# Patient Record
Sex: Male | Born: 1995 | Race: Black or African American | Hispanic: No | Marital: Single | Smoking: Current every day smoker
Health system: Southern US, Community
[De-identification: ages and names within clinical notes are randomized; demographics above are authoritative.]

## PROBLEM LIST (undated history)

## (undated) HISTORY — PX: ADENOIDECTOMY: SUR15

---
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
## 2013-01-25 ENCOUNTER — Telehealth (HOSPITAL_COMMUNITY): Payer: Self-pay | Admitting: *Deleted

## 2013-01-25 NOTE — ED Notes (Signed)
Pt. Called for his lab results. Pt. verified x 2 and given neg. results.  David Serrano 01/25/2013

## 2013-09-27 ENCOUNTER — Encounter (HOSPITAL_COMMUNITY): Payer: Self-pay | Admitting: Emergency Medicine

## 2013-09-27 ENCOUNTER — Other Ambulatory Visit (HOSPITAL_COMMUNITY)
Admission: RE | Admit: 2013-09-27 | Discharge: 2013-09-27 | Disposition: A | Discharge status: Home / Self Care | Payer: BC Managed Care – PPO | Source: Ambulatory Visit | Attending: Family Medicine | Admitting: Family Medicine

## 2013-09-27 ENCOUNTER — Emergency Department (INDEPENDENT_AMBULATORY_CARE_PROVIDER_SITE_OTHER)
Admission: EM | Admit: 2013-09-27 | Discharge: 2013-09-27 | Disposition: A | Discharge status: Home / Self Care | Payer: BC Managed Care – PPO | Source: Home / Self Care | Attending: Family Medicine | Admitting: Family Medicine

## 2013-09-27 DIAGNOSIS — Z113 Encounter for screening for infections with a predominantly sexual mode of transmission: Secondary | ICD-10-CM | POA: Insufficient documentation

## 2013-09-27 DIAGNOSIS — N342 Other urethritis: Secondary | ICD-10-CM

## 2013-09-27 NOTE — Discharge Instructions (Signed)
Thank you for coming in today.  We will call you if anything comes back positive.    Sexually Transmitted Disease A sexually transmitted disease (STD) is a disease or infection that may be passed (transmitted) from person to person, usually during sexual activity. This may happen by way of saliva, semen, blood, vaginal mucus, or urine. Common STDs include:   Gonorrhea.   Chlamydia.   Syphilis.   HIV and AIDS.   Genital herpes.   Hepatitis B and C.   Trichomonas.   Human papillomavirus (HPV).   Pubic lice.   Scabies.  Mites.  Bacterial vaginosis. WHAT ARE CAUSES OF STDs? An STD may be caused by bacteria, a virus, or parasites. STDs are often transmitted during sexual activity if one person is infected. However, they may also be transmitted through nonsexual means. STDs may be transmitted after:   Sexual intercourse with an infected person.   Sharing sex toys with an infected person.   Sharing needles with an infected person or using unclean piercing or tattoo needles.  Having intimate contact with the genitals, mouth, or rectal areas of an infected person.   Exposure to infected fluids during birth. WHAT ARE THE SIGNS AND SYMPTOMS OF STDs? Different STDs have different symptoms. Some people may not have any symptoms. If symptoms are present, they may include:   Painful or bloody urination.   Pain in the pelvis, abdomen, vagina, anus, throat, or eyes.   A skin rash, itching, or irritation.  Growths, ulcerations, blisters, or sores in the genital and anal areas.  Abnormal vaginal discharge with or without bad odor.   Penile discharge in men.   Fever.   Pain or bleeding during sexual intercourse.   Swollen glands in the groin area.   Yellow skin and eyes (jaundice). This is seen with hepatitis.   Swollen testicles.  Infertility.  Sores and blisters in the mouth. HOW ARE STDs DIAGNOSED? To make a diagnosis, your health care  provider may:   Take a medical history.   Perform a physical exam.   Take a sample of any discharge to examine.  Swab the throat, cervix, opening to the penis, rectum, or vagina for testing.  Test a sample of your first morning urine.   Perform blood tests.   Perform a Pap test, if this applies.   Perform a colposcopy.   Perform a laparoscopy.  HOW ARE STDs TREATED? Treatment depends on the STD. Some STDs may be treated but not cured.   Chlamydia, gonorrhea, trichomonas, and syphilis can be cured with antibiotic medicine.   Genital herpes, hepatitis, and HIV can be treated, but not cured, with prescribed medicines. The medicines lessen symptoms.   Genital warts from HPV can be treated with medicine or by freezing, burning (electrocautery), or surgery. Warts may come back.   HPV cannot be cured with medicine or surgery. However, abnormal areas may be removed from the cervix, vagina, or vulva.   If your diagnosis is confirmed, your recent sexual partners need treatment. This is true even if they are symptom-free or have a negative culture or evaluation. They should not have sex until their health care providers say it is okay. HOW CAN I REDUCE MY RISK OF GETTING AN STD? Take these steps to reduce your risk of getting an STD:  Use latex condoms, dental dams, and water-soluble lubricants during sexual activity. Do not use petroleum jelly or oils.  Avoid having multiple sex partners.  Do not have sex with someone who  has other sex partners.  Do not have sex with anyone you do not know or who is at high risk for an STD.  Avoid risky sex practices that can break your skin.  Do not have sex if you have open sores on your mouth or skin.  Avoid drinking too much alcohol or taking illegal drugs. Alcohol and drugs can affect your judgment and put you in a vulnerable position.  Avoid engaging in oral and anal sex acts.  Get vaccinated for HPV and hepatitis. If you  have not received these vaccines in the past, talk to your health care provider about whether one or both might be right for you.   If you are at risk of being infected with HIV, it is recommended that you take a prescription medicine daily to prevent HIV infection. This is called pre-exposure prophylaxis (PrEP). You are considered at risk if:  You are a man who has sex with other men (MSM).  You are a heterosexual man or woman and are sexually active with more than one partner.  You take drugs by injection.  You are sexually active with a partner who has HIV.  Talk with your health care provider about whether you are at high risk of being infected with HIV. If you choose to begin PrEP, you should first be tested for HIV. You should then be tested every 3 months for as long as you are taking PrEP.  WHAT SHOULD I DO IF I THINK I HAVE AN STD?  See your health care provider.   Tell your sexual partner(s). They should be tested and treated for any STDs.  Do not have sex until your health care provider says it is okay. WHEN SHOULD I GET IMMEDIATE MEDICAL CARE? Contact your health care provider right away if:   You have severe abdominal pain.  You are a man and notice swelling or pain in your testicles.  You are a woman and notice swelling or pain in your vagina. Document Released: 05/21/2002 Document Revised: 03/05/2013 Document Reviewed: 09/18/2012 [facility] Patient Information 2015 [place], [place]. This information is not intended to replace advice given to you by your health care provider. Make sure you discuss any questions you have with your health care provider.

## 2013-09-27 NOTE — ED Notes (Signed)
Pt reports having oral sex and states that girlfriend now has sore on mouth.  Pt and partner are here to tested for stds.

## 2013-09-27 NOTE — ED Notes (Signed)
Pt denies penile discharge and any other symptoms.  Mw,cma

## 2013-09-27 NOTE — ED Provider Notes (Signed)
David Serrano is a 18 y.o. male who presents to Urgent Care today for STD tests. Patient notes that his girlfriend developed a sore in her mouth and he is concerned that he or she may have contracted a sexually transmitted infection. He notes no significant symptoms and feels well otherwise.   History reviewed. No pertinent past medical history. History  Substance Use Topics  . Smoking status: Never Smoker   . Smokeless tobacco: Not on file  . Alcohol Use: Yes   ROS as above Medications: No current facility-administered medications for this encounter.   No current outpatient prescriptions on file.    Exam:  BP 139/70  Pulse 64  Temp(Src) 98.7 F (37.1 C) (Oral)  Resp 14  SpO2 100% Gen: Well NAD Lungs: Normal work of breathing. CTABL Heart: RRR no MRG Abd: NABS, Soft. Nontender, Nondistended Exts: Brisk capillary refill, warm and well perfused.  Genital: No inguinal lymphadenopathy Testicles are descended bilaterally and nontender without masses Penis is circumcised with no lesions or discharge  No results found for this or any previous visit (from the past 24 hour(s)). No results found.  Assessment and Plan: 18 y.o. male with possible urethritis. Urine cytology pending. Call patient with positive results.  Discussed warning signs or symptoms. Please see discharge instructions. Patient expresses understanding.   This note was created using Conservation officer, historic buildingsDragon voice recognition software. Any transcription errors are unintended.    Rodolph BongEvan S Corey, MD 09/27/13 [phone]

## 2013-10-02 NOTE — ED Notes (Signed)
patient called to inquire about his test results. After verifying ID, discussed negative reports

## 2014-05-08 ENCOUNTER — Emergency Department (HOSPITAL_COMMUNITY)
Admission: EM | Admit: 2014-05-08 | Discharge: 2014-05-08 | Disposition: A | Discharge status: Home / Self Care | Payer: BLUE CROSS/BLUE SHIELD | Attending: Emergency Medicine | Admitting: Emergency Medicine

## 2014-05-08 ENCOUNTER — Encounter (HOSPITAL_COMMUNITY): Payer: Self-pay | Admitting: Family Medicine

## 2014-05-08 ENCOUNTER — Emergency Department (HOSPITAL_COMMUNITY): Payer: BLUE CROSS/BLUE SHIELD

## 2014-05-08 DIAGNOSIS — Z79899 Other long term (current) drug therapy: Secondary | ICD-10-CM | POA: Diagnosis not present

## 2014-05-08 DIAGNOSIS — R05 Cough: Secondary | ICD-10-CM

## 2014-05-08 DIAGNOSIS — J069 Acute upper respiratory infection, unspecified: Secondary | ICD-10-CM | POA: Diagnosis not present

## 2014-05-08 DIAGNOSIS — R059 Cough, unspecified: Secondary | ICD-10-CM

## 2014-05-08 MED ORDER — HYDROCOD POLST-CHLORPHEN POLST 10-8 MG/5ML PO LQCR: 5.0000 mL | Freq: Two times a day (BID) | ORAL | Status: AC | PRN

## 2014-05-08 MED ORDER — ALBUTEROL 90 MCG/ACT IN AERS: 2.0000 | INHALATION_SPRAY | RESPIRATORY_TRACT | Status: AC | PRN

## 2014-05-08 NOTE — Discharge Instructions (Signed)
Cough, Adult  A cough is a reflex that helps clear your throat and airways. It can help heal the body or may be a reaction to an irritated airway. A cough may only last 2 or 3 weeks (acute) or may last more than 8 weeks (chronic).  CAUSES Acute cough:  Viral or bacterial infections. Chronic cough:  Infections.  Allergies.  Asthma.  Post-nasal drip.  Smoking.  Heartburn or acid reflux.  Some medicines.  Chronic lung problems (COPD).  Cancer. SYMPTOMS   Cough.  Fever.  Chest pain.  Increased breathing rate.  High-pitched whistling sound when breathing (wheezing).  Colored mucus that you cough up (sputum). TREATMENT   A bacterial cough may be treated with antibiotic medicine.  A viral cough must run its course and will not respond to antibiotics.  Your caregiver may recommend other treatments if you have a chronic cough. HOME CARE INSTRUCTIONS   Only take over-the-counter or prescription medicines for pain, discomfort, or fever as directed by your caregiver. Use cough suppressants only as directed by your caregiver.  Use a cold steam vaporizer or humidifier in your bedroom or home to help loosen secretions.  Sleep in a semi-upright position if your cough is worse at night.  Rest as needed.  Stop smoking if you smoke. SEEK IMMEDIATE MEDICAL CARE IF:   You have pus in your sputum.  Your cough starts to worsen.  You cannot control your cough with suppressants and are losing sleep.  You begin coughing up blood.  You have difficulty breathing.  You develop pain which is getting worse or is uncontrolled with medicine.  You have a fever. MAKE SURE YOU:   Understand these instructions.  Will watch your condition.  Will get help right away if you are not doing well or get worse. Document Released: 08/27/2010 Document Revised: 05/23/2011 Document Reviewed: 08/27/2010 [employer] Patient Information 2015 [employer]. This information is not intended  to replace advice given to you by your health care provider. Make sure you discuss any questions you have with your health care provider.  

## 2014-05-08 NOTE — ED Notes (Signed)
p here for productive cough x 1 week.

## 2014-05-08 NOTE — ED Notes (Signed)
MD at bedside. 

## 2014-05-08 NOTE — ED Provider Notes (Signed)
CSN: [id number]     Arrival date & time 05/08/14  2028 History   First MD Initiated Contact with Patient 05/08/14 2226     Chief Complaint  Patient presents with  . Cough     (Consider location/radiation/quality/duration/timing/severity/associated sxs/prior Treatment) HPI David Serrano is a 19 y.o. male who comes in for evaluation of cough. Patient states for the past week he has had an intermittent dry cough that causes some chest discomfort. No chest discomfort at rest only with cough. No radiation. He has not tried anything for his symptoms. He denies any fevers, shortness of breath, facial pain, rhinorrhea, nausea or vomiting, abdominal pain, myalgias, diaphoresis, headache.  History reviewed. No pertinent past medical history. Past Surgical History  Procedure Laterality Date  . Adenoidectomy     History reviewed. No pertinent family history. History  Substance Use Topics  . Smoking status: Never Smoker   . Smokeless tobacco: Not on file  . Alcohol Use: Yes    Review of Systems  Constitutional: Negative for fever.  Respiratory: Positive for cough. Negative for shortness of breath.   Cardiovascular: Negative for chest pain.  Skin: Negative for rash.  All other systems reviewed and are negative.     Allergies  Review of patient's allergies indicates no known allergies.  Home Medications   Prior to Admission medications   Medication Sig Start Date End Date Taking? Authorizing Provider  albuterol (PROVENTIL,VENTOLIN) 90 MCG/ACT inhaler Inhale 2 puffs into the lungs every 4 (four) hours as needed. 05/08/14 05/03/15  Sharlene Motts, PA-C  chlorpheniramine-HYDROcodone (TUSSIONEX PENNKINETIC ER) 10-8 MG/5ML LQCR Take 5 mLs by mouth every 12 (twelve) hours as needed for cough (cough). 05/08/14   Earle Gell Carlesha Seiple, PA-C   BP 135/69 mmHg  Pulse 80  Temp(Src) 98.8 F (37.1 C)  Resp 18  SpO2 98% Physical Exam  Constitutional: He appears well-developed and well-nourished.  No distress.  Awake, alert, nontoxic appearance.  HENT:  Head: Atraumatic.  Eyes: Right eye exhibits no discharge. Left eye exhibits no discharge.  Neck: Normal range of motion. Neck supple.  Cardiovascular: Normal rate, regular rhythm and normal heart sounds.   Pulmonary/Chest: Effort normal and breath sounds normal. No respiratory distress. He has no wheezes. He has no rales. He exhibits no tenderness.  Musculoskeletal: Normal range of motion. He exhibits no tenderness.  Baseline ROM, no obvious new focal weakness.  Neurological:  Mental status and motor strength appears baseline for patient and situation.  Skin: No rash noted. He is not diaphoretic.  Psychiatric: He has a normal mood and affect.  Nursing note and vitals reviewed.   ED Course  Procedures (including critical care time) Labs Review Labs Reviewed - No data to display  Imaging Review Dg Chest 2 View  05/08/2014   CLINICAL DATA:  Cough and congestion for 1 week  EXAM: CHEST  2 VIEW  COMPARISON:  None.  FINDINGS: Cardiac shadow is within normal limits. The lungs are well aerated bilaterally. Minimal atelectatic changes are noted in the right middle lobe. No sizable effusion is seen. No bony abnormality is noted.  IMPRESSION: Mild atelectasis in the right middle lobe.   Electronically Signed   By: Alcide Clever M.D.   On: 05/08/2014 21:15     EKG Interpretation None     Meds given in ED:  Medications - No data to display  New Prescriptions   ALBUTEROL (PROVENTIL,VENTOLIN) 90 MCG/ACT INHALER    Inhale 2 puffs into the lungs every 4 (four) hours  as needed.   CHLORPHENIRAMINE-HYDROCODONE (TUSSIONEX PENNKINETIC ER) 10-8 MG/5ML LQCR    Take 5 mLs by mouth every 12 (twelve) hours as needed for cough (cough).   Filed Vitals:   05/08/14 2049 05/08/14 2235 05/08/14 2240  BP: 135/69 159/96   Pulse: 80  68  Temp: 98.8 F (37.1 C)    Resp: 18    SpO2: 98%  99%    MDM  Vitals stable - WNL -afebrile, no hypoxia Pt  resting comfortably in ED. PE--benign lung exam. Imaging--chest x-ray shows mild atelectasis in right middle lobe.  DDX--Will treat empirically for viral URI with antitussives, bronchodilator. Low concern for other acute or emergent pathology at this time.   I discussed all relevant lab findings and imaging results with pt and they verbalized understanding. Discussed f/u with PCP within 48 hrs and return precautions, pt very amenable to plan.  Final diagnoses:  URI (upper respiratory infection)       Sharlene MottsBenjamin W Leonard Feigel, PA-C 05/08/14 2252  Vida RollerBrian D Miller, MD 05/09/14 [id number]

## 2016-03-14 IMAGING — CR DG CHEST 2V
2 series · 2 of 2 positions shown · non-contrast
Comparison: None.

CLINICAL DATA: Cough and congestion for 1 week

EXAM:
CHEST  2 VIEW

[chest pa]
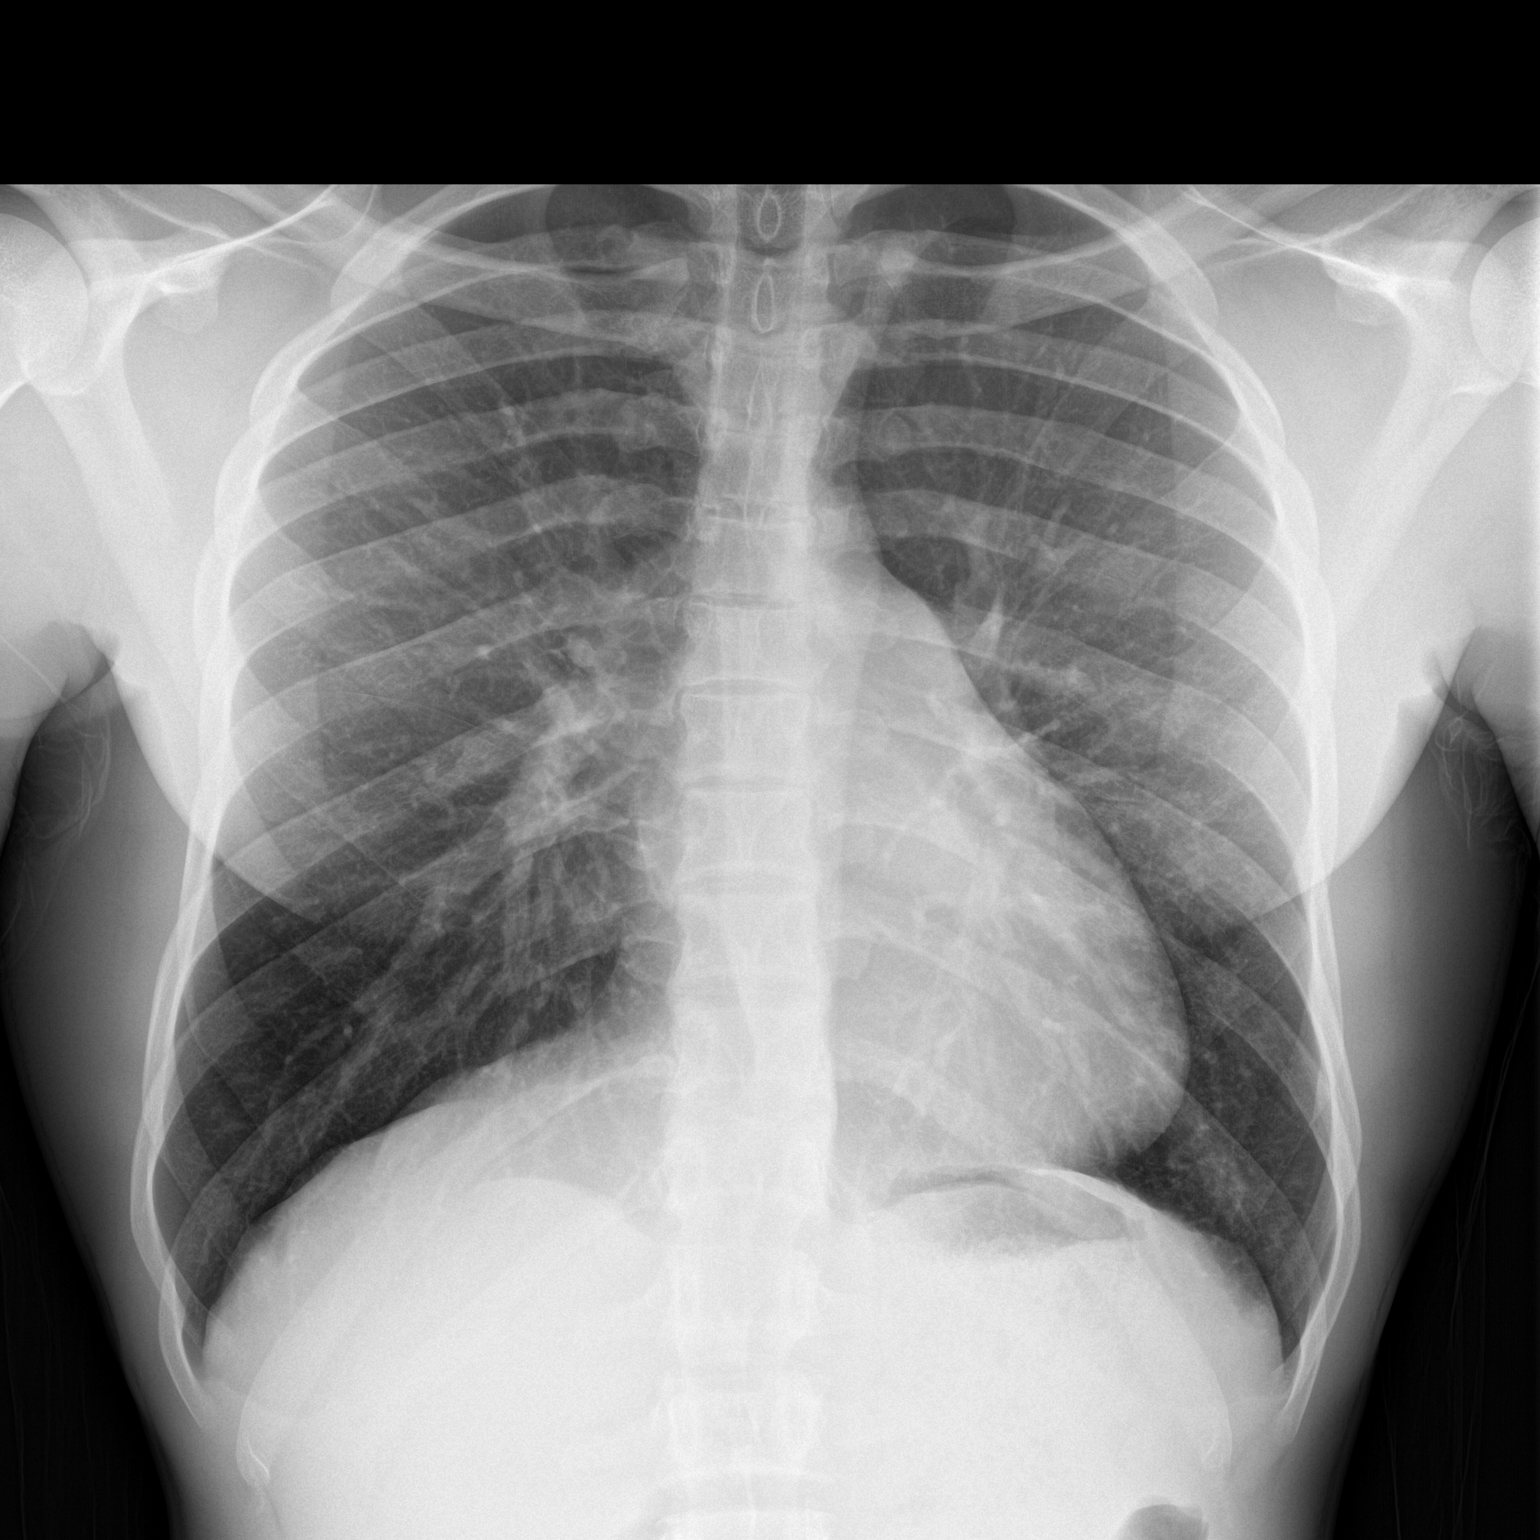

[chest lat]
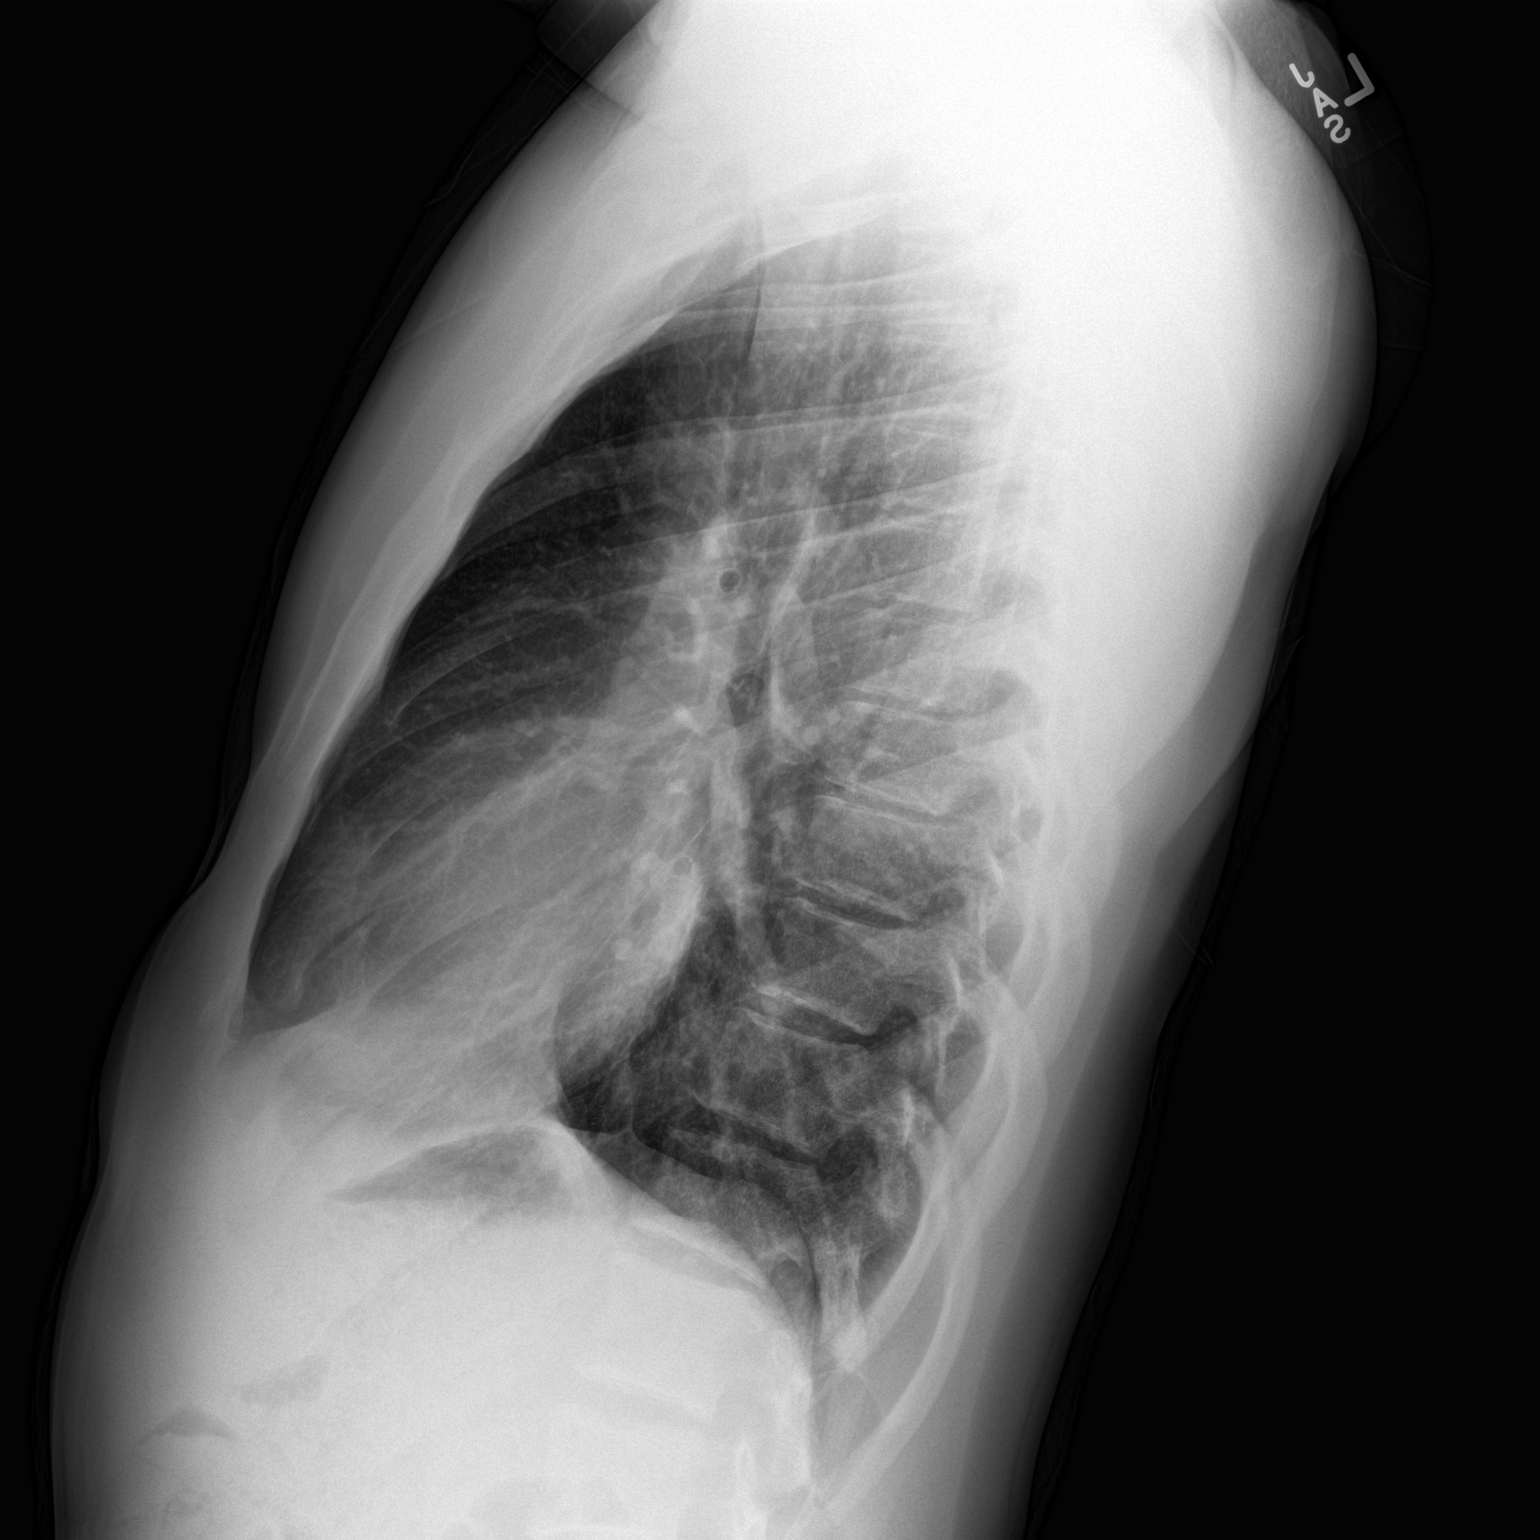

[2 of 2 positions shown; findings below may reference images not displayed]

FINDINGS: Cardiac shadow is within normal limits. The lungs are well aerated
bilaterally. Minimal atelectatic changes are noted in the right
middle lobe. No sizable effusion is seen. No bony abnormality is
noted.
IMPRESSION: Mild atelectasis in the right middle lobe.

## 2019-04-13 ENCOUNTER — Other Ambulatory Visit: Payer: Self-pay

## 2019-04-13 ENCOUNTER — Ambulatory Visit (HOSPITAL_COMMUNITY)
Admission: EM | Admit: 2019-04-13 | Discharge: 2019-04-13 | Disposition: A | Discharge status: Home / Self Care | Payer: BLUE CROSS/BLUE SHIELD | Attending: Family Medicine | Admitting: Family Medicine

## 2019-04-13 ENCOUNTER — Encounter (HOSPITAL_COMMUNITY): Payer: Self-pay

## 2019-04-13 DIAGNOSIS — R369 Urethral discharge, unspecified: Secondary | ICD-10-CM | POA: Insufficient documentation

## 2019-04-13 LAB — HIV ANTIBODY (ROUTINE TESTING W REFLEX): HIV Screen 4th Generation wRfx: NONREACTIVE

## 2019-04-13 MED ORDER — CEFTRIAXONE SODIUM 500 MG IJ SOLR
500.0000 mg | Freq: Once | INTRAMUSCULAR | Status: AC
  Administered 2019-04-13: 12:00:00 500 mg via INTRAMUSCULAR

## 2019-04-13 MED ORDER — CEFTRIAXONE SODIUM 500 MG IJ SOLR
INTRAMUSCULAR | Status: AC
  Filled 2019-04-13: qty 500

## 2019-04-13 MED ORDER — DOXYCYCLINE HYCLATE 100 MG PO CAPS: 100.0000 mg | ORAL_CAPSULE | Freq: Two times a day (BID) | ORAL | 0 refills | Status: AC

## 2019-04-13 NOTE — ED Provider Notes (Signed)
[place]   [id number] 04/13/19 Arrival Time: 1324  ASSESSMENT & PLAN:  1. Penile discharge       Discharge Instructions     You have been given the following today for treatment of suspected gonorrhea and/or chlamydia:  cefTRIAXone (ROCEPHIN) injection 500 mg  Please pick up your prescription for doxycycline 100 mg and begin taking twice daily for the next seven (7) days.  Even though we have treated you today, we have sent testing for sexually transmitted infections. We will notify you of any positive results once they are received. If required, we will prescribe any medications you might need.  Please refrain from all sexual activity for at least the next seven days.     Pending: Labs Reviewed  HIV ANTIBODY (ROUTINE TESTING W REFLEX)  RPR  CYTOLOGY, (ORAL, ANAL, URETHRAL) ANCILLARY ONLY    Will notify of any positive results. Instructed to refrain from sexual activity for at least seven days.  Reviewed expectations re: course of current medical issues. Questions answered. Outlined signs and symptoms indicating need for more acute intervention. Patient verbalized understanding. After Visit Summary given.   SUBJECTIVE:  David Serrano is a 24 y.o. male who presents with complaint of penile discharge. Onset abrupt. First noticed 3 d ago. Describes discharge as thick and opaque. No specific aggravating or alleviating factors reported. Denies: urinary frequency, dysuria and gross hematuria. Afebrile. No abdominal or pelvic pain. No n/v. No rashes or lesions. Reports that he is sexually active with single male partner; without condom use. OTC treatment: none. History of STI: none reported.   OBJECTIVE:  Vitals:   04/13/19 1118  BP: 132/74  Pulse: 67  Resp: 16  Temp: 98.5 F (36.9 C)  TempSrc: Oral  SpO2: 99%     General appearance: alert, cooperative, appears stated age and no distress Throat: lips, mucosa, and tongue normal; teeth and gums  normal CV: RRR Lungs: CTAB Back: no CVA tenderness; FROM at waist Abdomen: soft, non-tender GU: normal appearing genitalia Skin: warm and dry Psychological: alert and cooperative; normal mood and affect.    Labs Reviewed  HIV ANTIBODY (ROUTINE TESTING W REFLEX)  RPR  CYTOLOGY, (ORAL, ANAL, URETHRAL) ANCILLARY ONLY    No Known Allergies   Family History  Problem Relation Age of Onset  . Healthy Mother    Social History   Socioeconomic History  . Marital status: Single    Spouse name: Not on file  . Number of children: Not on file  . Years of education: Not on file  . Highest education level: Not on file  Occupational History  . Not on file  Tobacco Use  . Smoking status: Current Every Day Smoker    Types: Cigars  . Smokeless tobacco: Never Used  Substance and Sexual Activity  . Alcohol use: Yes  . Drug use: Yes    Types: Marijuana  . Sexual activity: Yes    Birth control/protection: None  Other Topics Concern  . Not on file  Social History Narrative  . Not on file   Social Determinants of Health   Financial Resource Strain:   . Difficulty of Paying Living Expenses: Not on file  Food Insecurity:   . Worried About Charity fundraiser in the Last Year: Not on file  . Ran Out of Food in the Last Year: Not on file  Transportation Needs:   . Lack of Transportation (Medical): Not on file  . Lack of Transportation (Non-Medical): Not on file  Physical Activity:   . Days of Exercise per Week: Not on file  . Minutes of Exercise per Session: Not on file  Stress:   . Feeling of Stress : Not on file  Social Connections:   . Frequency of Communication with Friends and Family: Not on file  . Frequency of Social Gatherings with Friends and Family: Not on file  . Attends Religious Services: Not on file  . Active Member of Clubs or Organizations: Not on file  . Attends Banker Meetings: Not on file  . Marital Status: Not on file  Intimate Partner  Violence:   . Fear of Current or Ex-Partner: Not on file  . Emotionally Abused: Not on file  . Physically Abused: Not on file  . Sexually Abused: Not on file          Mardella Layman, MD 04/13/19 1212

## 2019-04-13 NOTE — ED Triage Notes (Signed)
Patient presents to Urgent Care with complaints of penile discharge and pain with urination since three days ago. Patient reports he had unprotected sex recently and has been feeling badly ever since.

## 2019-04-13 NOTE — Discharge Instructions (Addendum)

## 2019-04-14 LAB — RPR: RPR Ser Ql: NONREACTIVE

## 2019-04-15 ENCOUNTER — Telehealth (HOSPITAL_COMMUNITY): Payer: Self-pay | Admitting: Emergency Medicine

## 2019-04-15 LAB — CYTOLOGY, (ORAL, ANAL, URETHRAL) ANCILLARY ONLY
Chlamydia: NEGATIVE
Neisseria Gonorrhea: POSITIVE — AB
Trichomonas: POSITIVE — AB

## 2019-04-15 MED ORDER — METRONIDAZOLE 500 MG PO TABS: 2000.0000 mg | ORAL_TABLET | Freq: Once | ORAL | 0 refills | Status: AC

## 2019-04-15 NOTE — Telephone Encounter (Signed)
Test for gonorrhea was positive. This was treated at the urgent care visit with IM rocephin 500mg. Please refrain from sexual intercourse for 7 days after treatment to give the medicine time to work. Sexual partners need to be notified and tested/treated. Condoms may reduce risk of reinfection. Recheck or followup with PCP for further evaluation if symptoms are not improving. [facility] notified.   Trichomonas is positive. Rx  for Flagyl 2 grams, once was sent to the pharmacy of record. Pt needs education to refrain from sexual intercourse for 7 days to give the medicine time to work. Sexual partners need to be notified and tested/treated. Condoms may reduce risk of reinfection. Recheck for further evaluation if symptoms are not improving.   Patient contacted by phone and made aware of    results. Pt verbalized understanding and had all questions answered.
